# Patient Record
Sex: Male | Born: 1935 | Race: Black or African American | Hispanic: No | Marital: Single
Health system: Southern US, Community
[De-identification: ages and names within clinical notes are randomized; demographics above are authoritative.]

---

## 2004-03-29 ENCOUNTER — Ambulatory Visit: Payer: Self-pay | Admitting: Gastroenterology

## 2005-01-18 ENCOUNTER — Inpatient Hospital Stay: Payer: Self-pay | Admitting: Internal Medicine

## 2005-01-27 ENCOUNTER — Other Ambulatory Visit: Payer: Self-pay

## 2005-01-27 ENCOUNTER — Inpatient Hospital Stay: Payer: Self-pay

## 2005-02-13 ENCOUNTER — Ambulatory Visit: Payer: Self-pay | Admitting: Family Medicine

## 2005-10-21 ENCOUNTER — Emergency Department: Payer: Self-pay | Admitting: Emergency Medicine

## 2005-12-18 ENCOUNTER — Emergency Department: Payer: Self-pay | Admitting: Unknown Physician Specialty

## 2006-01-23 ENCOUNTER — Emergency Department: Payer: Self-pay | Admitting: Emergency Medicine

## 2006-01-23 ENCOUNTER — Other Ambulatory Visit: Payer: Self-pay

## 2007-02-09 ENCOUNTER — Encounter: Payer: Self-pay | Admitting: Family Medicine

## 2007-06-09 ENCOUNTER — Ambulatory Visit: Payer: Self-pay | Admitting: Gastroenterology

## 2007-06-10 ENCOUNTER — Encounter: Payer: Self-pay | Admitting: Family Medicine

## 2007-06-14 ENCOUNTER — Encounter: Payer: Self-pay | Admitting: Family Medicine

## 2007-07-12 ENCOUNTER — Encounter: Payer: Self-pay | Admitting: Family Medicine

## 2007-08-12 ENCOUNTER — Encounter: Payer: Self-pay | Admitting: Family Medicine

## 2007-09-11 ENCOUNTER — Encounter: Payer: Self-pay | Admitting: Family Medicine

## 2008-02-19 ENCOUNTER — Emergency Department: Payer: Self-pay | Admitting: Emergency Medicine

## 2008-02-19 ENCOUNTER — Other Ambulatory Visit: Payer: Self-pay

## 2008-07-01 ENCOUNTER — Ambulatory Visit: Payer: Self-pay | Admitting: Geriatric Medicine

## 2008-07-02 ENCOUNTER — Inpatient Hospital Stay: Payer: Self-pay | Admitting: Internal Medicine

## 2008-07-04 ENCOUNTER — Ambulatory Visit: Payer: Self-pay | Admitting: Internal Medicine

## 2008-08-04 ENCOUNTER — Ambulatory Visit: Payer: Self-pay | Admitting: Geriatric Medicine

## 2009-06-10 IMAGING — CT CT CHEST W/O CM
1 series · 15 of 32 positions shown, 19 images · non-contrast
Comparison: none

REASON FOR EXAM: f/u left upper lobe nodule
COMMENTS:

[Series 2: soft tissue · axial · 0.96mm/px · z∈[-1144,-864]mm · 15 of 62 slices shown, 19 images]
[im 4/62  soft-tissue]
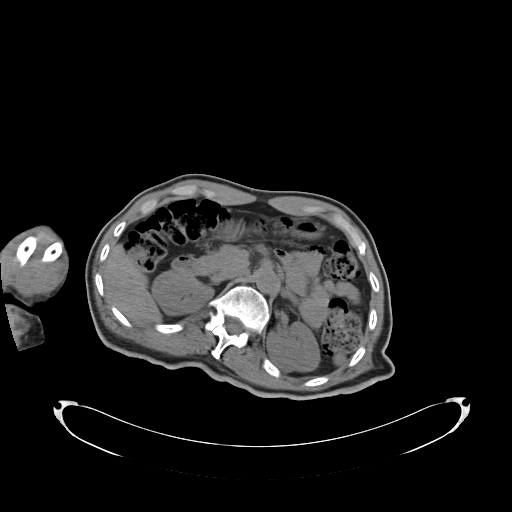
[im 4/62  bone]
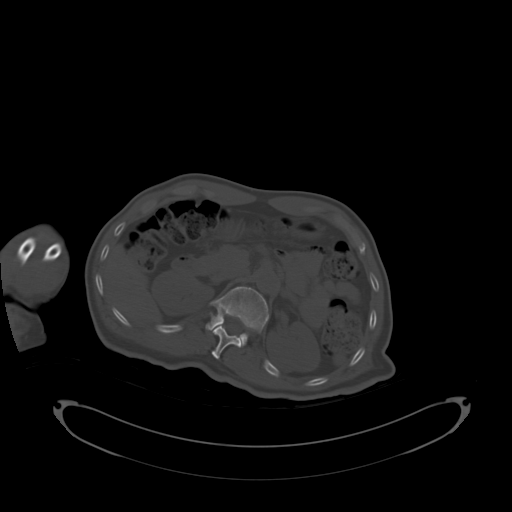
[im 8/62  soft-tissue]
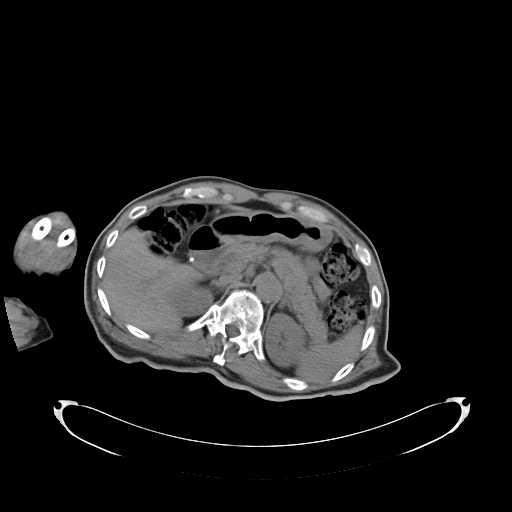
[im 12/62  soft-tissue]
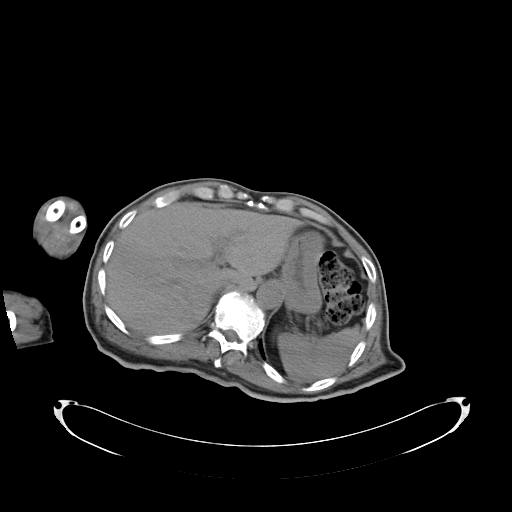
[im 18/62  soft-tissue]
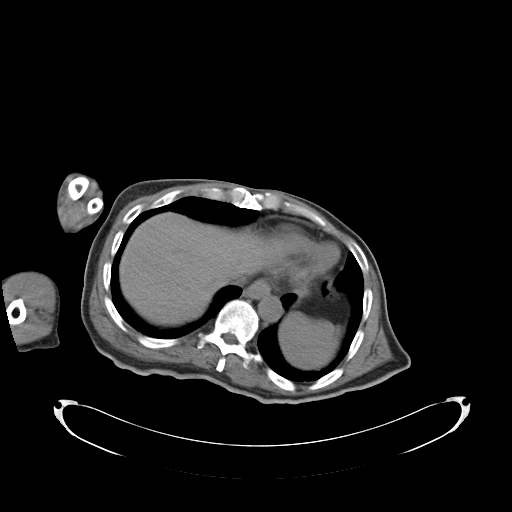
[im 22/62  soft-tissue]
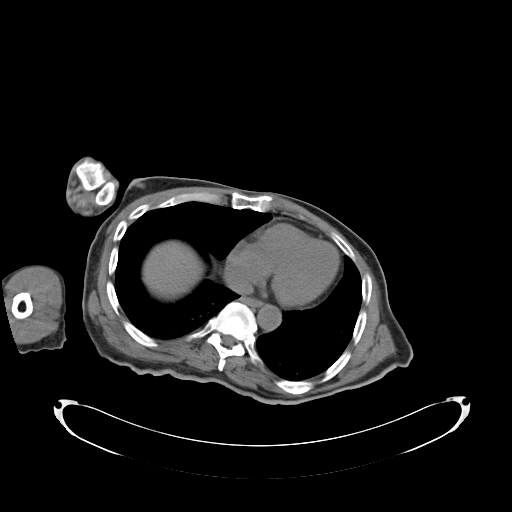
[im 26/62  soft-tissue]
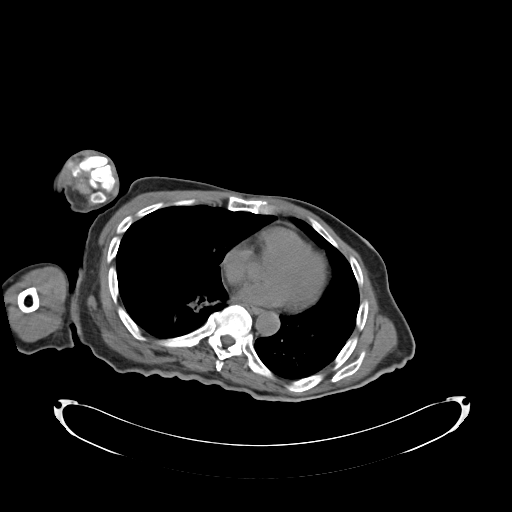
[im 32/62  soft-tissue]
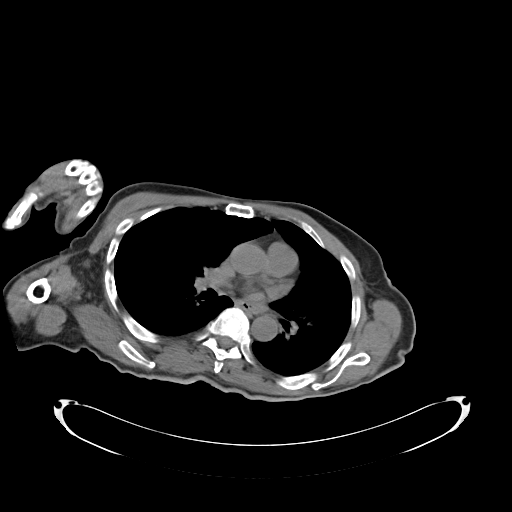
[im 36/62  soft-tissue]
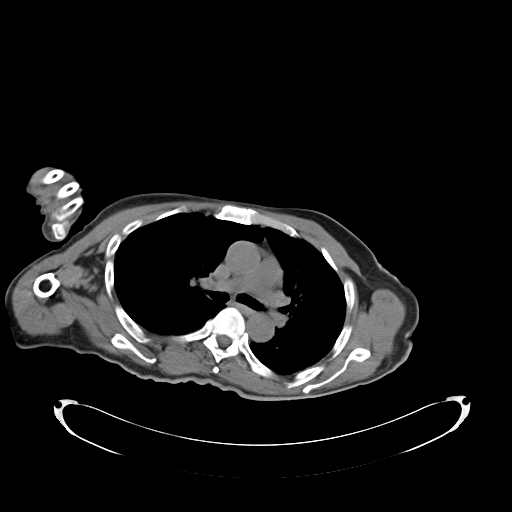
[im 40/62  soft-tissue]
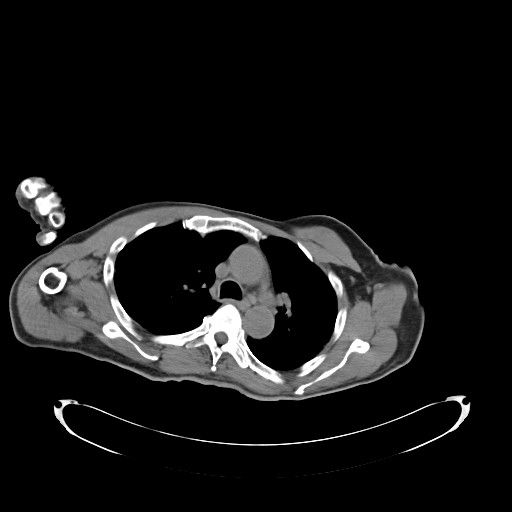
[im 40/62  bone]
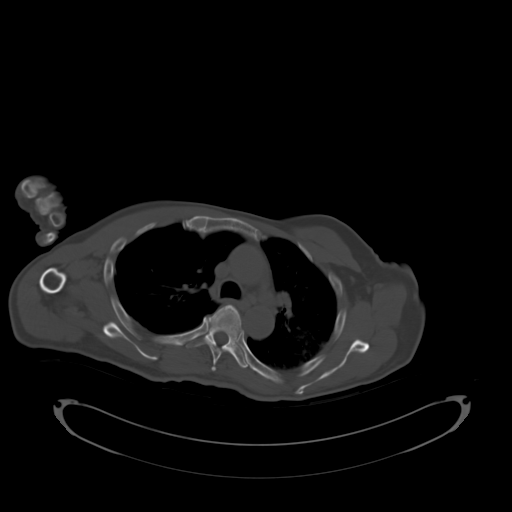
[im 44/62  soft-tissue]
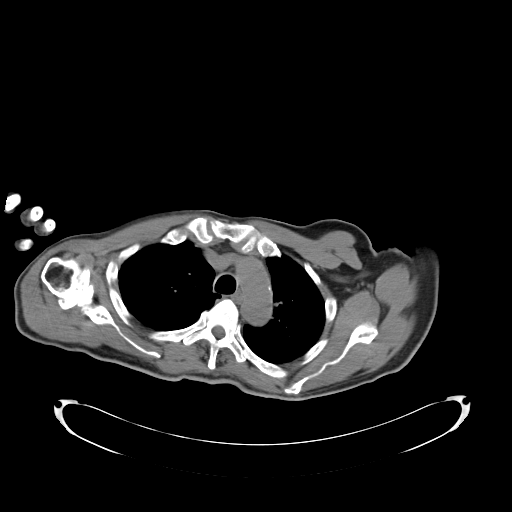
[im 50/62  soft-tissue]
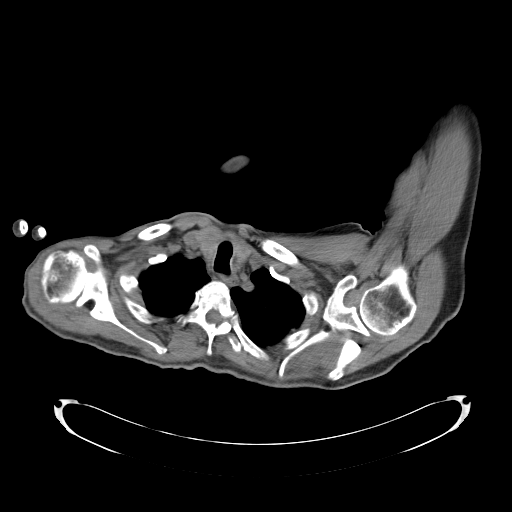
[im 54/62  soft-tissue]
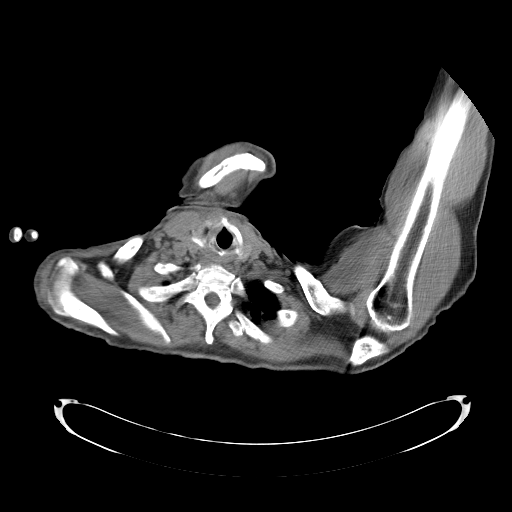
[im 54/62  lung]
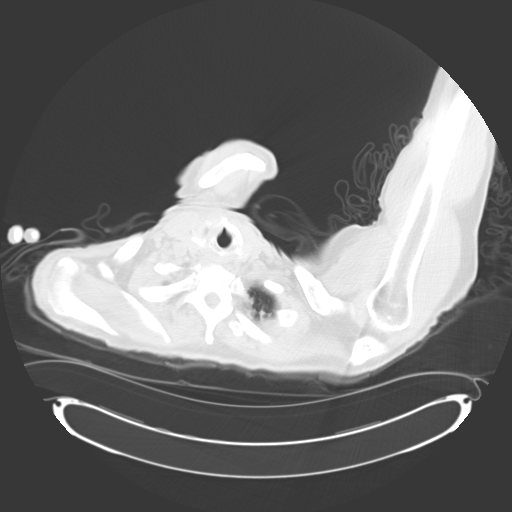
[im 56/62  lung]
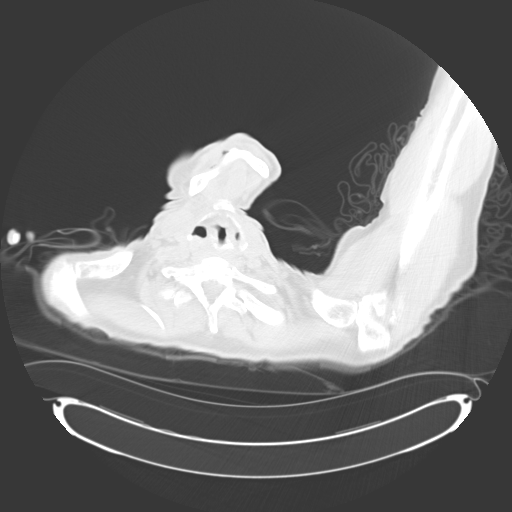
[im 58/62  soft-tissue]
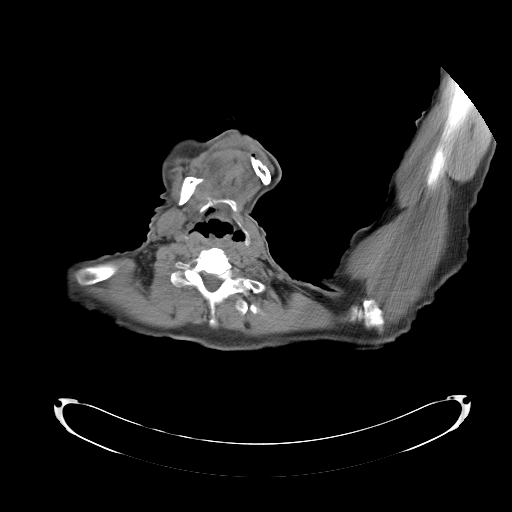
[im 58/62  lung]
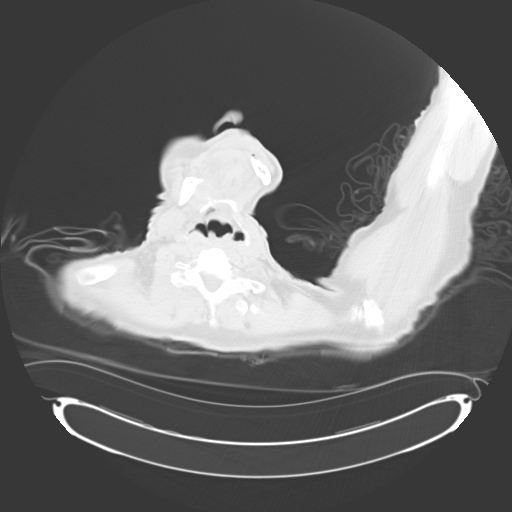
[im 60/62  lung]
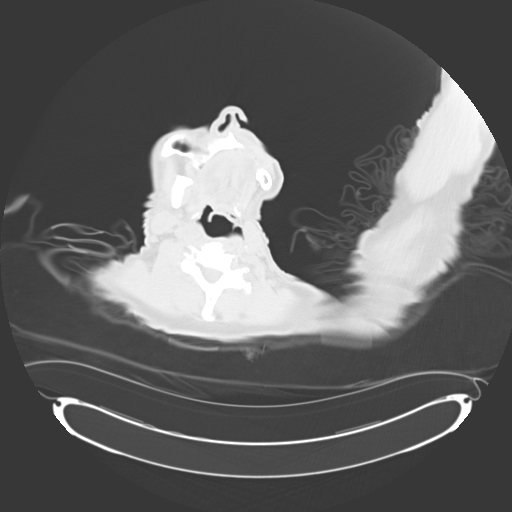

[15 of 32 positions shown; findings below may reference images not displayed]

PROCEDURE:     CT  - CT CHEST WITHOUT CONTRAST  - August 04, 2008 [DATE]

RESULT:     Axial CT scanning was performed through the chest without IV
contrast due to the inability to obtain IV access despite multiple attempts.
Comparison made to a prior study 02 July, 2008. Review of
3-dimensional reconstructed images was performed separately on the [employer] server monitor.

Within the left upper lobe increased interstitial density is seen. This is
much improved when compared to the prior study and consistent with ongoing
resolution of pneumonia. The areas of nodularity demonstrated on the prior
study are no longer evident. There is a small amount of increased density
visible in the superior segment of the left lower lobe extending more
inferiorly and this too has improved considerably. On the right mildly
increased density posteriorly in the lower lobe is present but has improved
as well.

The cardiac chambers are normal in size. The caliber of the thoracic aorta
is normal. I do not see evidence of a pleural effusion. No bulky mediastinal
or hilar lymph nodes are evident. The thyroid lobes do not appear enlarged.
IMPRESSION: 1. There has been improvement in the appearance of both lungs consistent
with ongoing resolution presumably of pneumonia. Additional followup imaging
in 3 to 4 weeks is recommended to assure complete clearing.
2. There is likely underlying COPD and some pulmonary fibrotic change.

## 2010-06-29 ENCOUNTER — Other Ambulatory Visit: Payer: Self-pay | Admitting: Geriatric Medicine

## 2010-08-28 ENCOUNTER — Ambulatory Visit: Payer: Self-pay | Admitting: Geriatric Medicine

## 2011-01-18 ENCOUNTER — Inpatient Hospital Stay: Payer: Self-pay | Admitting: Internal Medicine

## 2011-02-01 ENCOUNTER — Other Ambulatory Visit: Payer: Self-pay | Admitting: Geriatric Medicine

## 2012-05-03 ENCOUNTER — Inpatient Hospital Stay: Payer: Self-pay | Admitting: Internal Medicine

## 2012-05-03 LAB — COMPREHENSIVE METABOLIC PANEL
Albumin: 3.8 g/dL (ref 3.4–5.0)
Alkaline Phosphatase: 95 U/L (ref 50–136)
Anion Gap: 8 (ref 7–16)
Bilirubin,Total: 0.8 mg/dL (ref 0.2–1.0)
Chloride: 109 mmol/L — ABNORMAL HIGH (ref 98–107)
EGFR (African American): >60
Glucose: 161 mg/dL — ABNORMAL HIGH (ref 65–99)
Osmolality: 284 (ref 275–301)
Potassium: 3.9 mmol/L (ref 3.5–5.1)
Sodium: 141 mmol/L (ref 136–145)
Total Protein: 8.3 g/dL — ABNORMAL HIGH (ref 6.4–8.2)

## 2012-05-03 LAB — CBC
MCH: 29.9 pg (ref 26.0–34.0)
MCV: 89 fL (ref 80–100)
Platelet: 168 10*3/uL (ref 150–440)
RBC: 5.31 10*6/uL (ref 4.40–5.90)
RDW: 14 % (ref 11.5–14.5)

## 2012-05-04 LAB — COMPREHENSIVE METABOLIC PANEL
Alkaline Phosphatase: 75 U/L (ref 50–136)
Anion Gap: 11 (ref 7–16)
BUN: 13 mg/dL (ref 7–18)
Bilirubin,Total: 0.8 mg/dL (ref 0.2–1.0)
Chloride: 106 mmol/L (ref 98–107)
Creatinine: 1.19 mg/dL (ref 0.60–1.30)
EGFR (African American): >60
Potassium: 3.9 mmol/L (ref 3.5–5.1)
SGOT(AST): 21 U/L (ref 15–37)
SGPT (ALT): 15 U/L (ref 12–78)
Total Protein: 7.3 g/dL (ref 6.4–8.2)

## 2012-05-04 LAB — CBC WITH DIFFERENTIAL/PLATELET
Basophil #: 0 10*3/uL (ref 0.0–0.1)
Eosinophil #: 0 10*3/uL (ref 0.0–0.7)
Eosinophil %: 0 %
HCT: 40.9 % (ref 40.0–52.0)
HGB: 13.9 g/dL (ref 13.0–18.0)
Lymphocyte #: 0.3 10*3/uL — ABNORMAL LOW (ref 1.0–3.6)
Lymphocyte %: 5.2 %
MCHC: 33.9 g/dL (ref 32.0–36.0)
MCV: 88 fL (ref 80–100)
Monocyte #: 0.2 x10 3/mm (ref 0.2–1.0)
RBC: 4.65 10*6/uL (ref 4.40–5.90)
RDW: 13.8 % (ref 11.5–14.5)
WBC: 5.5 10*3/uL (ref 3.8–10.6)

## 2012-05-04 LAB — URINALYSIS, COMPLETE
Glucose,UR: NEGATIVE mg/dL (ref 0–75)
Ketone: NEGATIVE
Leukocyte Esterase: NEGATIVE
Nitrite: NEGATIVE
Ph: 5 (ref 4.5–8.0)
Protein: 100
Specific Gravity: 1.027 (ref 1.003–1.030)

## 2012-05-05 LAB — CREATININE, SERUM
Creatinine: 1.31 mg/dL — ABNORMAL HIGH (ref 0.60–1.30)
EGFR (African American): >60

## 2012-05-05 LAB — CBC WITH DIFFERENTIAL/PLATELET
Eosinophil #: 0 10*3/uL (ref 0.0–0.7)
Eosinophil %: 0.1 %
Lymphocyte #: 0.7 10*3/uL — ABNORMAL LOW (ref 1.0–3.6)
MCH: 28.7 pg (ref 26.0–34.0)
MCHC: 32.1 g/dL (ref 32.0–36.0)
Monocyte #: 0.3 x10 3/mm (ref 0.2–1.0)
Neutrophil #: 5.3 10*3/uL (ref 1.4–6.5)
Neutrophil %: 84.2 %
Platelet: 154 10*3/uL (ref 150–440)
RDW: 13.3 % (ref 11.5–14.5)
WBC: 6.4 10*3/uL (ref 3.8–10.6)

## 2012-05-05 LAB — URINALYSIS, COMPLETE
Bacteria: NONE SEEN
Glucose,UR: NEGATIVE mg/dL (ref 0–75)
Ketone: NEGATIVE
Nitrite: NEGATIVE
Protein: NEGATIVE
RBC,UR: 7 /HPF (ref 0–5)
Specific Gravity: 1.006 (ref 1.003–1.030)
Squamous Epithelial: <1
WBC UR: 2 /HPF (ref 0–5)

## 2012-05-06 LAB — CREATININE, SERUM: EGFR (Non-African Amer.): >60

## 2012-05-06 LAB — VANCOMYCIN, TROUGH: Vancomycin, Trough: 9 ug/mL — ABNORMAL LOW (ref 10–20)

## 2012-05-07 LAB — CBC WITH DIFFERENTIAL/PLATELET
Basophil #: 0 10*3/uL (ref 0.0–0.1)
Basophil %: 0.3 %
Eosinophil #: 0 10*3/uL (ref 0.0–0.7)
Lymphocyte %: 7.5 %
MCH: 29.5 pg (ref 26.0–34.0)
MCHC: 33.7 g/dL (ref 32.0–36.0)
Monocyte #: 0.8 x10 3/mm (ref 0.2–1.0)
Monocyte %: 9.8 %
Neutrophil %: 82.3 %
Platelet: 177 10*3/uL (ref 150–440)
WBC: 8.1 10*3/uL (ref 3.8–10.6)

## 2012-05-07 LAB — COMPREHENSIVE METABOLIC PANEL
Albumin: 2.7 g/dL — ABNORMAL LOW (ref 3.4–5.0)
Alkaline Phosphatase: 85 U/L (ref 50–136)
Anion Gap: 12 (ref 7–16)
BUN: 8 mg/dL (ref 7–18)
Co2: 24 mmol/L (ref 21–32)
EGFR (Non-African Amer.): >60
Glucose: 115 mg/dL — ABNORMAL HIGH (ref 65–99)
Osmolality: 286 (ref 275–301)
Potassium: 3.3 mmol/L — ABNORMAL LOW (ref 3.5–5.1)
SGOT(AST): 23 U/L (ref 15–37)
Sodium: 144 mmol/L (ref 136–145)
Total Protein: 6.9 g/dL (ref 6.4–8.2)

## 2012-05-09 LAB — CULTURE, BLOOD (SINGLE)

## 2012-05-11 LAB — CULTURE, BLOOD (SINGLE)

## 2012-10-05 ENCOUNTER — Other Ambulatory Visit: Payer: Self-pay

## 2012-10-05 LAB — URINALYSIS, COMPLETE
Bilirubin,UR: NEGATIVE
Glucose,UR: NEGATIVE mg/dL (ref 0–75)
Nitrite: POSITIVE
Ph: 5 (ref 4.5–8.0)
RBC,UR: 16 /HPF (ref 0–5)
Squamous Epithelial: 7

## 2012-10-09 LAB — URINE CULTURE

## 2013-03-01 ENCOUNTER — Other Ambulatory Visit: Payer: Self-pay | Admitting: Family Medicine

## 2013-03-01 LAB — BASIC METABOLIC PANEL
Anion Gap: 7 (ref 7–16)
BUN: 13 mg/dL (ref 7–18)
Calcium, Total: 9 mg/dL (ref 8.5–10.1)
Chloride: 111 mmol/L — ABNORMAL HIGH (ref 98–107)
Creatinine: 1.09 mg/dL (ref 0.60–1.30)
EGFR (African American): >60
EGFR (Non-African Amer.): >60
Glucose: 149 mg/dL — ABNORMAL HIGH (ref 65–99)
Sodium: 141 mmol/L (ref 136–145)

## 2013-03-01 LAB — CBC WITH DIFFERENTIAL/PLATELET
Basophil #: 0.1 10*3/uL (ref 0.0–0.1)
Basophil %: 0.5 %
Eosinophil #: 0 10*3/uL (ref 0.0–0.7)
Eosinophil %: 0.1 %
HGB: 14.6 g/dL (ref 13.0–18.0)
Lymphocyte %: 2.8 %
MCH: 30.5 pg (ref 26.0–34.0)
MCHC: 34.9 g/dL (ref 32.0–36.0)
MCV: 88 fL (ref 80–100)
Monocyte %: 4.7 %
Neutrophil %: 91.9 %
RDW: 13.5 % (ref 11.5–14.5)
WBC: 10.7 10*3/uL — ABNORMAL HIGH (ref 3.8–10.6)

## 2013-03-10 IMAGING — CR DG CHEST 1V PORT
1 series · 1 of 1 positions shown · non-contrast
Comparison: none

REASON FOR EXAM: right pna
COMMENTS:

PROCEDURE:     DXR - DXR PORTABLE CHEST SINGLE VIEW  - May 04, 2012  [DATE]
RESULT:     Comparison: 05/03/2012

[ap]
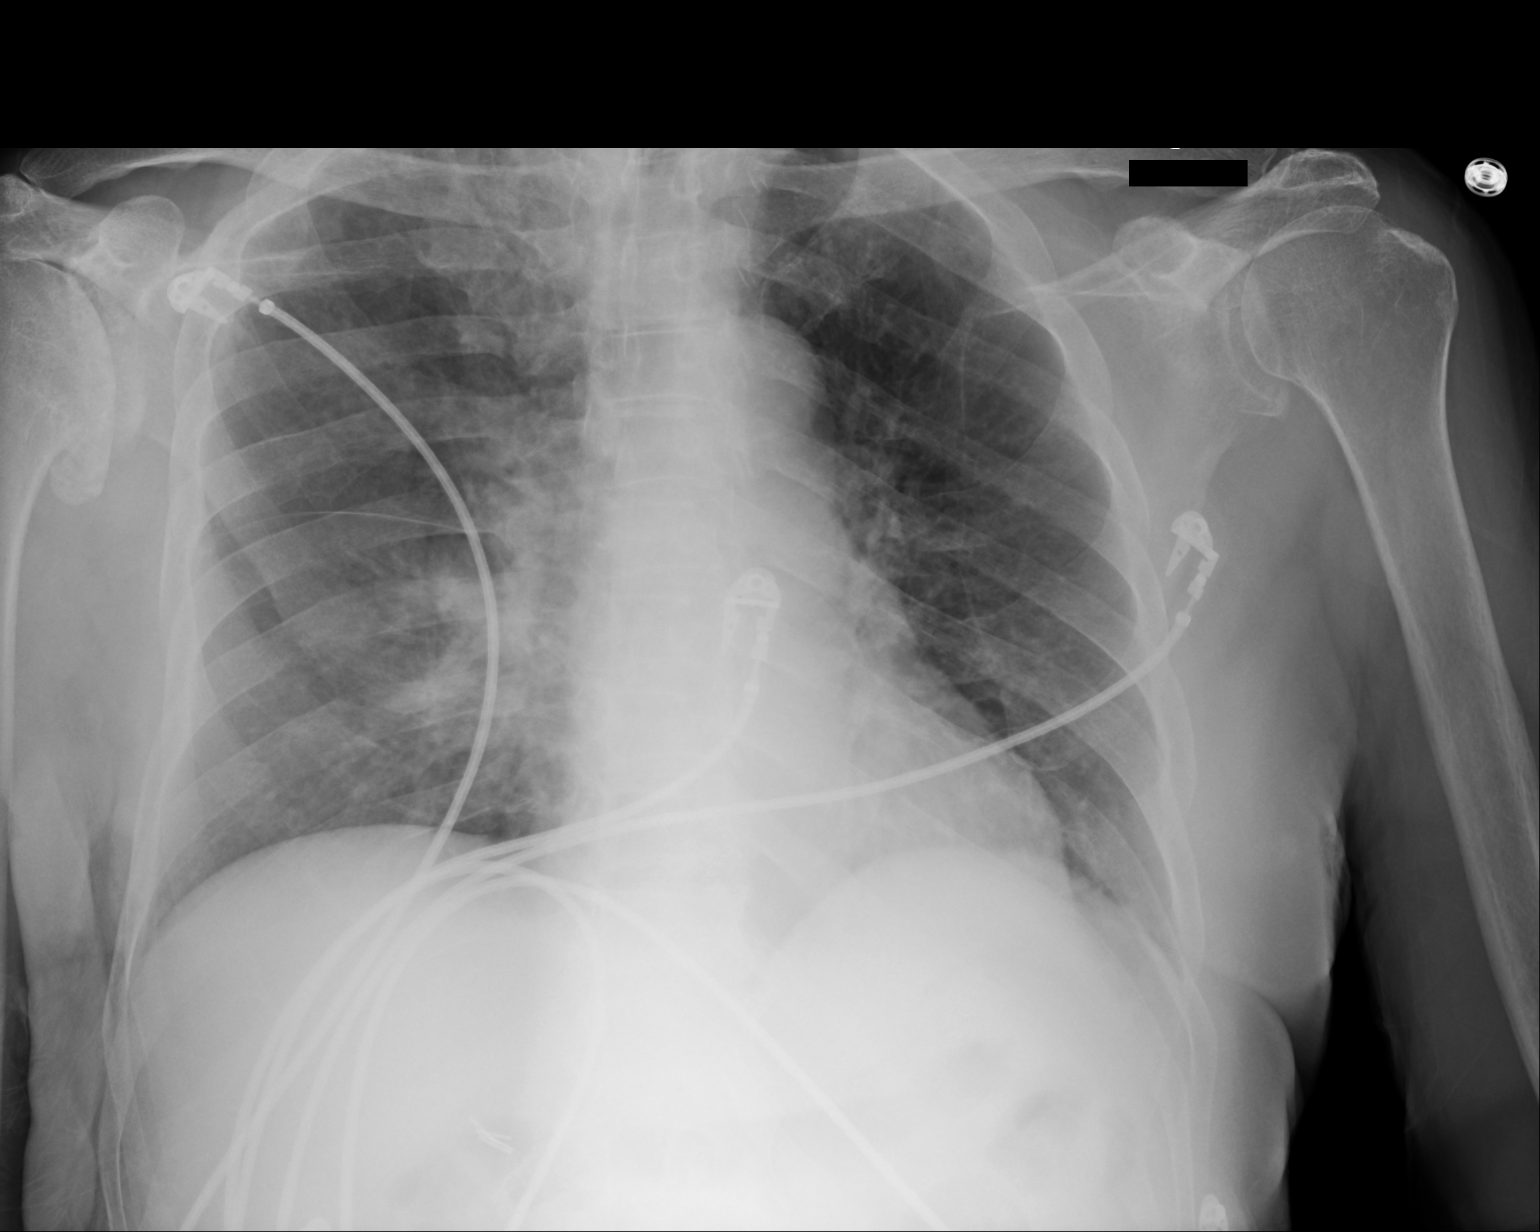

[1 of 1 positions shown; findings below may reference images not displayed]

FINDINGS: Single portable AP chest radiograph is provided. There is increased left
interstitial and alveolar airspace opacities as well as more focal right
perihilar airspace disease concerning for pneumonia. There may be mild
superimposed interstitial edema. There is no pleural effusion or
pneumothorax. Normal cardiomediastinal silhouette. The osseous structures
are unremarkable.
IMPRESSION: Please see above.

[REDACTED]

## 2013-08-18 ENCOUNTER — Emergency Department: Payer: Self-pay | Admitting: Emergency Medicine

## 2013-08-18 LAB — COMPREHENSIVE METABOLIC PANEL
ALBUMIN: 3.5 g/dL (ref 3.4–5.0)
ALK PHOS: 66 U/L
ALT: 15 U/L (ref 12–78)
ANION GAP: 4 — AB (ref 7–16)
BUN: 23 mg/dL — ABNORMAL HIGH (ref 7–18)
Bilirubin,Total: 0.4 mg/dL (ref 0.2–1.0)
Calcium, Total: 9.2 mg/dL (ref 8.5–10.1)
Chloride: 125 mmol/L — ABNORMAL HIGH (ref 98–107)
Co2: 24 mmol/L (ref 21–32)
Creatinine: 0.97 mg/dL (ref 0.60–1.30)
EGFR (African American): >60
Glucose: 162 mg/dL — ABNORMAL HIGH (ref 65–99)
OSMOLALITY: 311 (ref 275–301)
POTASSIUM: 3.5 mmol/L (ref 3.5–5.1)
SGOT(AST): 18 U/L (ref 15–37)
Sodium: 153 mmol/L — ABNORMAL HIGH (ref 136–145)
Total Protein: 7.9 g/dL (ref 6.4–8.2)

## 2013-08-18 LAB — CBC
HCT: 47.9 % (ref 40.0–52.0)
HGB: 15.5 g/dL (ref 13.0–18.0)
MCH: 28.9 pg (ref 26.0–34.0)
MCHC: 32.4 g/dL (ref 32.0–36.0)
MCV: 89 fL (ref 80–100)
Platelet: 247 10*3/uL (ref 150–440)
RBC: 5.37 10*6/uL (ref 4.40–5.90)
RDW: 13.5 % (ref 11.5–14.5)
WBC: 13.5 10*3/uL — ABNORMAL HIGH (ref 3.8–10.6)

## 2013-08-18 LAB — TROPONIN I: Troponin-I: <0.02 ng/mL

## 2013-09-10 DEATH — deceased

## 2014-08-30 NOTE — H&P (Signed)
DATE OF BIRTH:  09-05-1933  PRIMARY CARE PHYSICIAN:  Hillery Aldo, MD  CHIEF COMPLAINT:  Cough, confusion, shortness of breath.   HISTORY OF PRESENTING ILLNESS:  A 81 year old male patient with history of polio, right hemiplegia with multiple contractures, diabetes, hypertension, dementia, resident of nursing home, presents to the Emergency Room, sent in from [facility] nursing home after he was noticed to be lethargic with cough and shortness of breath. The patient was noticed to have fever of 103.5, needing 10 L of oxygen. Here in the Emergency Room, the patient has been found to have right hilar pneumonia. The patient is unable to provide any history. All the history has been gathered reviewing all the records. Family unavailable at this time.   PAST MEDICAL HISTORY:  1.  Polio with multiple contractures.  2.  Cerebrovascular accident with right hemiplegia.  3.  Diabetes mellitus.  4.  Hypertension.  5.  Glaucoma.  6.  Brain tumor diagnosed in 2009 at [facility] with surgical treatment advised, but family decided against it.  7.  GERD. 8.  Hyperlipidemia.  9.  Recurrent UTIs.   CODE STATUS:  DO NOT RESUSCITATE/DO NOT INTUBATE.   ALLERGIES:  No known drug allergies.   FAMILY HISTORY:  Reviewed, unknown.   SOCIAL HISTORY:  The patient lives at [facility] nursing home. His contact person is his sister. He was a former smoker, quit many years back.   REVIEW OF SYSTEMS:  Unobtainable, as the patient is noncommunicative at this time.   HOME MEDICATIONS: Include: 1.  Acetaminophen 500 mg 2 tablets orally 1 to 3 times a day as needed.  2.  Aspirin 325 mg oral once a day.  3.  Atrovent orally, no schedule available.  4.  Metoprolol tartrate 25 mg half a tablet orally 2 times a day.  5.  Norvasc 2.5 mg orally once a day.  6.  Potassium chloride 20 mEq orally times a day.  7.  Senna-S 2 tablets orally once a day.  8.  Vitamin C 500 mg orally once a day.   PHYSICAL EXAMINATION:  VITAL  SIGNS:  Temperature of 103.5, pulse of 112, respirations 24, blood pressure 131/66, saturating 95% on 3 L oxygen.  GENERAL:  Frail, elderly, African-American male patient lying in bed, confused, opens eyes when calling name.  PSYCHIATRIC:  Is awake, but noncommunicative. Orientation cannot be assessed. Does have a depressed look. Poor judgment.  HEENT:  Atraumatic, normocephalic. Oral mucosa dry and pink. No oral ulcers or thrush. External ears and nose normal. No pallor. No icterus. Pupils bilaterally equal and reactive to light.  NECK:  Supple. No thyromegaly. No palpable lymph nodes. Trachea midline. No carotid bruit or JVD.  CARDIOVASCULAR:  S1, S2, tachycardic without any murmurs. Peripheral pulses 1+. No edema.  RESPIRATORY:  Increased work of breathing. Using accessory muscles. Has crackles in the right lower lobe. Decreased air entry at the left base.  GASTROINTESTINAL:  Soft abdomen, nontender. Bowel sounds present. No hepatosplenomegaly palpable.  GENITOURINARY:  No CVA tenderness. No bladder distention. Has a diaper on.  SKIN:  Warm and extremely dry. No petechiae, rash or ulcers noticed.  MUSCULOSKELETAL:  No joint swelling, redness, effusion of the large joints. Muscle contractures in all extremities. Muscle wasting of his right lower extremity. With increased tone.  NEUROLOGICAL: Motor strength unable to test, as the patient does not follow commands. External ocular muscles intact. Withdraws to pain.   LABORATORY STUDIES:  Glucose of 161, BUN 12, creatinine 1.28,  sodium 141, potassium 3.9, chloride 109. GFR 54, albumin 3.8. WBC 5.6, hemoglobin 15.9, platelets of 168, MCV 89.   IMAGING:  Chest x-ray shows right perihilar pneumonia. No pneumothorax or pulmonary edema.   ASSESSMENT AND PLAN:  1.  Acute respiratory failure secondary to right-sided healthcare-acquired pneumonia. The patient will be started on IV vancomycin, Zosyn along with azithromycin. Sputum and blood cultures have be  obtained. We will await culture results. Continue oxygen support as needed and nebulizer treatment as needed. The patient is not on any oxygen outside the hospital. We will get a repeat chest x-ray in the morning for followup.  2.  Sepsis secondary to the healthcare-acquired pneumonia. Start the patient on IV fluids. We will check a lactate level.  3.  Acute encephalopathy secondary to the pneumonia. The patient does seem to have significant dementia, but seems to be verbal, as per the nursing home notes. We will monitor for improvement with treatment of the pneumonia.  4.  Diabetes mellitus. Start him on sliding scale insulin. The patient likely will not have much p.o. intake considering the confusion. Needs to be monitored closely.  5.  Hypertension. Continue medications.  6.  Deep vein thrombosis prophylaxis with heparin.  7.  CODE STATUS: DO NOT RESUSCITATE/DO NOT INTUBATE, as per the records from nursing home.   Time spent today on this case was 45 minutes.   ____________________________ Molinda BailiffSrikar R. Margarita Bobrowski, MD srs:ms D: 05/03/2012 17:14:01 ET T: 05/03/2012 17:49:26 ET JOB#: [id number]  cc: Wardell HeathSrikar R. Elpidio AnisSudini, MD, <Dictator> Sarah "Sallie" Allena KatzPatel, MD [facility] Orie FishermanSRIKAR R Sosha Shepherd MD ELECTRONICALLY SIGNED 05/03/2012 21:25

## 2014-09-02 NOTE — Discharge Summary (Signed)
PATIENT NAME:  Erik Moon, [name]:  [id number] DATE OF BIRTH:  13-Oct-1933  DATE OF ADMISSION:  05/03/2012 DATE OF DISCHARGE:  05/08/2012  ADMITTING PHYSICIAN: Milagros LollSrikar Sudini, MD  DISCHARGING PHYSICIAN: Enid Baasadhika Eljay Lave, MD  PRIMARY CARE PHYSICIAN: Hillery AldoSarah Patel, MD  CONSULTANTS: None.  DISCHARGE DIAGNOSES: 1. Acute respiratory failure.  2. Sepsis.  3. Multilobar pneumonia.  4. Metabolic encephalopathy.  5. History of cerebrovascular accident with right hemiplegia.  6. Foley with multiple contractures and bedbound.  7. Hypertension.  8. Glaucoma.  [facility]. [facility], surgical treatment advised.  10. Gastroesophageal reflux disease.  11. History of frequent urinary tract infections.  12. Dysphagia.  13. Hematuria from Foley trauma while in the hospital.   DISCHARGE MEDICATIONS: 1. Norvasc 2.5 mg p.o. daily.  2. Aspirin 325 mg p.o. daily.  3. Vitamin C 500 mg p.o. daily.  4. Senna Colace 50 mg/8.6 mg 2 tablets once a day.  5. Metoprolol 12.5 mg p.o. 2 times a day. 6. Potassium chloride 20 mEq powder twice a day.  7. Tylenol 500 to 1,000 mg one tablet 2 to 3 times a day.  8. Atropine drops sublingually for excessive secretions.  9. Oral atropine as needed.  10. DuoNebs 3 mL every 6 hours as needed for shortness of breath.  11. Augmentin 875 mg 1 tablet p.o. twice a day for 9 days.   DISCHARGE DIET: Low sodium, dysphagia diet, pureed consistency with honey thick liquids and 100% supervision at meals and aspiration precautions needed.    DISCHARGE FOLLOWUP INSTRUCTIONS: PCP followup in 1 to 2 weeks.   LABS AND IMAGING STUDIES: WBC 8.1, hemoglobin 12.7, hematocrit 37.5 and platelet count 177.  Sodium 144, potassium 3.3, chloride 108, bicarbonate 24, BUN 8, creatinine 0.87, glucose 115 and calcium 9.0.  ALT 17, AST 23, alkaline phosphatase 85, total bilirubin 0.6 and albumin 2.7.   CT of the chest with contrast is showing multilobar  pneumonia.   Urinalysis is negative for infection.   Blood cultures on admission were negative. Urine cultures were negative.  Chest x-ray on admission os showing multifocal airspace disease that is more focal on the right side.   BRIEF HOSPITAL COURSE: [name] is a 81 year old African American male with history of polio, brain tumor and CVA with multiple contractures who is bedbound at baseline, a nursing home resident, [facility] to high grade fevers, lethargy and also hypoxia requiring 10 liters of oxygen.  1. Acute respiratory failure secondary to multilobar pneumonia as seen on chest x-ray and also CT scan. Because he is a nursing home resident, he was treated for healthcare-acquired pneumonia with vancomycin and Zosyn and also azithromycin. He still spiked fevers for the first one to two days of hospitalization and then gradually became afebrile. His blood cultures were negative.  He was not able to have good sputum cultures because of his poor cough. It seems like he was responding to the antibiotics. He finished his 5 day course of azithromycin and his other antibiotics are being changed to Augmentin for another 9 days to finish 2 week course of antibiotic therapy. He can also get DuoNeb treatments as needed for wheezing and coarse rhonchi on exam.  2. Sepsis and metabolic encephalopathy secondary to above, improving now. The patient is more alert and back to baseline. He is nonverbal but tries to nod head to questions.  3. Hypertension. His home medications of Norvasc and metoprolol are being  continued at this time.  4. Hypokalemia. His potassium was replaced while in the hospital.  5. Hematuria secondary to Foley trauma, resolved. He did not get a Foley catheter in the hospital and he is able to use his Depends.  His course has been otherwise uneventful in the hospital.   DISCHARGE CONDITION: Stable.   DISCHARGE DISPOSITION: Back to skilled  nursing facility, [facility].   CODE STATUS: DO NOT RESUSCITATE as he has an out-of-facility DNR order already signed and in place in the chart.   TIME SPENT ON DISCHARGE: 45 minutes. ____________________________ Enid Baas, MD rk:sb D: 05/08/2012 08:39:42 ET T: 05/08/2012 09:38:49 ET JOB#: [id number]  cc: Enid Baas, MD, <Dictator> Enid Baas MD ELECTRONICALLY SIGNED 05/22/2012 14:15
